# Patient Record
Sex: Female | Born: 2000 | Race: White | Hispanic: No | Marital: Single | Smoking: Never smoker
Health system: Southern US, Community
[De-identification: ages and names within clinical notes are randomized; demographics above are authoritative.]

## PROBLEM LIST (undated history)

## (undated) DIAGNOSIS — E7800 Pure hypercholesterolemia, unspecified: Secondary | ICD-10-CM

## (undated) HISTORY — PX: MYRINGOTOMY: SUR874

---
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
## 2022-03-23 ENCOUNTER — Other Ambulatory Visit: Payer: Self-pay | Admitting: Family Medicine

## 2022-03-23 ENCOUNTER — Ambulatory Visit: Payer: Self-pay

## 2022-03-23 DIAGNOSIS — Z0000 Encounter for general adult medical examination without abnormal findings: Secondary | ICD-10-CM

## 2022-08-29 ENCOUNTER — Other Ambulatory Visit: Payer: Self-pay

## 2022-08-29 ENCOUNTER — Ambulatory Visit
Admission: RE | Admit: 2022-08-29 | Discharge: 2022-08-29 | Disposition: A | Discharge status: Home / Self Care | Payer: 59 | Source: Ambulatory Visit | Attending: Nurse Practitioner | Admitting: Nurse Practitioner

## 2022-08-29 VITALS — BP 147/88 | HR 109 | Temp 98.8°F | Resp 20

## 2022-08-29 DIAGNOSIS — J029 Acute pharyngitis, unspecified: Secondary | ICD-10-CM | POA: Insufficient documentation

## 2022-08-29 DIAGNOSIS — R590 Localized enlarged lymph nodes: Secondary | ICD-10-CM | POA: Diagnosis not present

## 2022-08-29 LAB — POCT RAPID STREP A (OFFICE): Rapid Strep A Screen: NEGATIVE

## 2022-08-29 LAB — POCT MONO SCREEN (KUC): Mono, POC: NEGATIVE

## 2022-08-29 MED ORDER — LIDOCAINE VISCOUS HCL 2 % MT SOLN: 15.0000 mL | OROMUCOSAL | 0 refills | Status: AC | PRN

## 2022-08-29 NOTE — ED Triage Notes (Signed)
Pt reports sore throat, fever, chills, lower back aches since last night. Pt reports partner has something similar and pt states history of tonsillitis. Last dose of tylenol last night.

## 2022-08-29 NOTE — ED Provider Notes (Signed)
[facility]    CSN: [id number] Arrival date & time: 08/29/22  6644      History   Chief Complaint Chief Complaint  Patient presents with   Sore Throat    Sore throat, running a 102 fever, chills, and body aches. Thinking it may be tonsillitis because i've had it twice in the past. and my partner is sick and has tonsil stones in her throat. - Entered by patient    HPI Sandra Harrington is a 22 y.o. female.   Presents today with 1 day history of fever, chills, stuffy nose, sore throat, headache, bilateral ear pressure, decreased appetite, and fatigue.  Tmax at home 102 F.  No cough, shortness of breath or chest pain, runny nose, abdominal pain, nausea/vomiting, or diarrhea.  Reports partner is sick at home with similar symptoms for the past few days.  Has taken Tylenol for symptoms with minimal improvement.    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      Home Medications    Prior to Admission medications   Medication Sig Start Date End Date Taking? Authorizing Provider  lidocaine (XYLOCAINE) 2 % solution Use as directed 15 mLs in the mouth or throat every 3 (three) hours as needed for mouth pain. 08/29/22  Yes Valentino Nose, NP  acetaminophen (TYLENOL) 325 MG tablet Take 650 mg by mouth every 6 (six) hours as needed for mild pain.    [provider]  benzonatate (TESSALON) 100 MG capsule Take 1 capsule (100 mg total) by mouth 3 (three) times daily as needed. 08/17/21   Margaretann Loveless, PA-C  ipratropium (ATROVENT)  0.03 % nasal spray Place 2 sprays into both nostrils every 12 (twelve) hours. 08/17/21   Margaretann Loveless, PA-C  loratadine (CLARITIN) 5 MG/5ML syrup Take 10 mg by mouth at bedtime.    [provider]  Melatonin 1 MG TABS Take 0.5 tablets (0.5 mg total) by mouth daily. 01/14/15   Lurene Shadow, MD  Norgestimate-Ethinyl Estradiol Triphasic (TRI-SPRINTEC) 0.18/0.215/0.25 MG-35 MCG tablet Take 1 tablet by mouth daily for 30 days. 08/08/18 09/07/18  Richrd Sox, MD  predniSONE (DELTASONE) 20 MG tablet Take 2 tablets (40 mg total) by mouth daily with breakfast. 08/19/21   Particia Nearing, PA-C  promethazine-dextromethorphan (PROMETHAZINE-DM) 6.25-15 MG/5ML syrup Take 5 mLs by mouth 4 (four) times daily as needed. 08/19/21   Particia Nearing, PA-C    Family History Family History  Problem Relation Age of Onset   Hypertension Mother    Hypertension Father    Cancer Other    Heart failure Other    Diabetes Other    Cancer Maternal Grandmother    Heart failure Maternal Grandfather     Social History Social History   Tobacco Use   Smoking status: Never   Smokeless tobacco: Never  Substance Use Topics   Alcohol use: No   Drug use: No     Allergies   Penicillins and Sulfa antibiotics   Review of  Systems Review of Systems Per HPI  Physical Exam Triage Vital Signs ED Triage Vitals  Enc Vitals Group     BP 08/29/22 0843 (!) 147/88     Pulse Rate 08/29/22 0843 (!) 109     Resp 08/29/22 0843 20     Temp 08/29/22 0843 98.8 F (37.1 C)     Temp Source 08/29/22 0843 Oral     SpO2 08/29/22 0843 92 %     Weight --      Height --      Head Circumference --      Peak Flow --      Pain Score 08/29/22 0842 5     Pain Loc --      Pain Edu? --      Excl. in GC? --    No data found.  Updated Vital Signs BP (!) 147/88 (BP Location: Right Arm)   Pulse (!) 109   Temp 98.8 F (37.1 C) (Oral)   Resp 20   LMP 08/01/2022 (Approximate)   SpO2 92%    Visual Acuity Right Eye Distance:   Left Eye Distance:   Bilateral Distance:    Right Eye Near:   Left Eye Near:    Bilateral Near:     Physical Exam Vitals and nursing note reviewed.  Constitutional:      General: She is not in acute distress.    Appearance: Normal appearance. She is not ill-appearing or toxic-appearing.  HENT:     Head: Normocephalic and atraumatic.     Right Ear: Tympanic membrane, ear canal and external ear normal. No drainage, swelling or tenderness. No middle ear effusion. Tympanic membrane is not erythematous.     Left Ear: Tympanic membrane, ear canal and external ear normal. No drainage, swelling or tenderness.  No middle ear effusion. Tympanic membrane is not erythematous.     Nose: No congestion or rhinorrhea.     Mouth/Throat:     Mouth: Mucous membranes are moist.     Pharynx: Oropharynx is clear. No oropharyngeal exudate or posterior oropharyngeal erythema.     Tonsils: No tonsillar exudate.  Eyes:     General: No scleral icterus.    Extraocular Movements: Extraocular movements intact.  Cardiovascular:     Rate and Rhythm: Normal rate and regular rhythm.     Heart sounds: Normal heart sounds. No murmur heard. Pulmonary:     Effort: Pulmonary effort is normal. No respiratory distress.     Breath sounds: Normal breath sounds. No wheezing, rhonchi or rales.  Abdominal:     General: Abdomen is flat. Bowel sounds are normal. There is no distension.     Palpations: Abdomen is soft.  Musculoskeletal:     Cervical back: Normal range of motion and neck supple.  Lymphadenopathy:     Cervical: Cervical adenopathy present.  Skin:    General: Skin is warm and dry.     Coloration: Skin is not jaundiced or pale.     Findings: No erythema or rash.  Neurological:     Mental Status: She is alert and oriented to person, place, and time.     Motor: No weakness.  Psychiatric:        Behavior: Behavior is cooperative.      UC Treatments / Results   Labs (all labs ordered are listed, but only abnormal results are displayed) Labs Reviewed  CULTURE, GROUP A STREP [facility])  POCT RAPID STREP A (OFFICE)  POCT MONO SCREEN ([facility])  EKG   Radiology No results found.  Procedures Procedures (including critical care time)  Medications Ordered in UC Medications - No data to display  Initial Impression / Assessment and Plan / UC Course  I have reviewed the triage vital signs and the nursing notes.  Pertinent labs & imaging results that were available during my care of the patient were reviewed by me and considered in my medical decision making (see chart for details).   Patient is well-appearing, afebrile, not tachypneic, oxygenating well on room air.  Patient is mildly hypertensive and slightly tachycardic today in triage.  1. Acute pharyngitis, unspecified etiology 2. Cervical lymphadenopathy Rapid strep throat test is negative as well as Monospot is negative Centor score today is 2 Throat culture is pending Recommend treatment with azithromycin 500 mg daily for 5 days if positive If negative, likely viral etiology Supportive care discussed with patient Start lidocaine rinses, continue antipyretics/analgesics ER and return precautions discussed Note given for work  The patient was given the opportunity to ask questions.  All questions answered to their satisfaction.  The patient is in agreement to this plan.    Final Clinical Impressions(s) / UC Diagnoses   Final diagnoses:  Acute pharyngitis, unspecified etiology  Cervical lymphadenopathy     Discharge Instructions      Rapid strep throat test is negative today.  The throat culture is pending and we will call you if positive later this week.  Monospot test is negative today.   You have a viral upper respiratory infection.  Symptoms should improve over the next week to 10 days.  If you develop chest pain or shortness of breath, go to the emergency room.  Some things  that can make you feel better are: - Increased rest - Increasing fluid with water/sugar free electrolytes - Acetaminophen and ibuprofen as needed for fever/pain - Salt water gargling, chloraseptic spray and throat lozenges for sore throat - Lidocaine rinses for sore throat - OTC guaifenesin (Mucinex) 600 mg twice daily for congestion - Saline sinus flushes or a neti pot - Humidifying the air     ED Prescriptions     Medication Sig Dispense Auth. Provider   lidocaine (XYLOCAINE) 2 % solution Use as directed 15 mLs in the mouth or throat every 3 (three) hours as needed for mouth pain. 100 mL Valentino Nose, NP      PDMP not reviewed this encounter.   Valentino Nose, NP 08/29/22 1056

## 2022-08-29 NOTE — Discharge Instructions (Addendum)
Rapid strep throat test is negative today.  The throat culture is pending and we will call you if positive later this week.  Monospot test is negative today.   You have a viral upper respiratory infection.  Symptoms should improve over the next week to 10 days.  If you develop chest pain or shortness of breath, go to the emergency room.  Some things that can make you feel better are: - Increased rest - Increasing fluid with water/sugar free electrolytes - Acetaminophen and ibuprofen as needed for fever/pain - Salt water gargling, chloraseptic spray and throat lozenges for sore throat - Lidocaine rinses for sore throat - OTC guaifenesin (Mucinex) 600 mg twice daily for congestion - Saline sinus flushes or a neti pot - Humidifying the air

## 2022-09-01 LAB — CULTURE, GROUP A STREP (THRC)

## 2022-10-02 ENCOUNTER — Other Ambulatory Visit: Payer: Self-pay | Admitting: Oncology

## 2022-10-02 ENCOUNTER — Other Ambulatory Visit (HOSPITAL_COMMUNITY)
Admission: RE | Admit: 2022-10-02 | Discharge: 2022-10-02 | Disposition: A | Discharge status: Home / Self Care | Payer: 59 | Source: Ambulatory Visit | Attending: Oncology | Admitting: Oncology

## 2022-10-02 DIAGNOSIS — Z006 Encounter for examination for normal comparison and control in clinical research program: Secondary | ICD-10-CM | POA: Insufficient documentation

## 2022-10-03 ENCOUNTER — Other Ambulatory Visit: Payer: Self-pay | Admitting: Oncology

## 2022-10-03 DIAGNOSIS — Z006 Encounter for examination for normal comparison and control in clinical research program: Secondary | ICD-10-CM

## 2023-01-25 DIAGNOSIS — M79672 Pain in left foot: Secondary | ICD-10-CM | POA: Diagnosis not present
